# Patient Record
Sex: Female | Born: 1985 | Race: Black or African American | Hispanic: Yes | Smoking: Never smoker
Health system: Southern US, Community
[De-identification: ages and names within clinical notes are randomized; demographics above are authoritative.]

## PROBLEM LIST (undated history)

## (undated) DIAGNOSIS — M329 Systemic lupus erythematosus, unspecified: Secondary | ICD-10-CM

## (undated) DIAGNOSIS — F329 Major depressive disorder, single episode, unspecified: Secondary | ICD-10-CM

## (undated) DIAGNOSIS — M3500 Sicca syndrome, unspecified: Secondary | ICD-10-CM

## (undated) DIAGNOSIS — F419 Anxiety disorder, unspecified: Secondary | ICD-10-CM

## (undated) DIAGNOSIS — F32A Depression, unspecified: Secondary | ICD-10-CM

## (undated) HISTORY — DX: Systemic lupus erythematosus, unspecified: M32.9

## (undated) HISTORY — PX: DILATION AND CURETTAGE OF UTERUS: SHX78

## (undated) HISTORY — DX: Anxiety disorder, unspecified: F41.9

## (undated) HISTORY — DX: Depression, unspecified: F32.A

## (undated) HISTORY — DX: Major depressive disorder, single episode, unspecified: F32.9

## (undated) HISTORY — DX: Sjogren syndrome, unspecified: M35.00

## (undated) HISTORY — PX: COSMETIC SURGERY: SHX468

---

## 2012-11-07 DIAGNOSIS — M779 Enthesopathy, unspecified: Secondary | ICD-10-CM | POA: Insufficient documentation

## 2016-02-28 DIAGNOSIS — F419 Anxiety disorder, unspecified: Secondary | ICD-10-CM | POA: Insufficient documentation

## 2016-02-28 DIAGNOSIS — G8929 Other chronic pain: Secondary | ICD-10-CM | POA: Insufficient documentation

## 2016-02-28 DIAGNOSIS — M25579 Pain in unspecified ankle and joints of unspecified foot: Secondary | ICD-10-CM

## 2016-02-28 DIAGNOSIS — E669 Obesity, unspecified: Secondary | ICD-10-CM | POA: Insufficient documentation

## 2016-02-28 DIAGNOSIS — L83 Acanthosis nigricans: Secondary | ICD-10-CM | POA: Insufficient documentation

## 2016-02-28 DIAGNOSIS — G4700 Insomnia, unspecified: Secondary | ICD-10-CM | POA: Insufficient documentation

## 2016-04-24 DIAGNOSIS — M329 Systemic lupus erythematosus, unspecified: Secondary | ICD-10-CM | POA: Insufficient documentation

## 2016-09-25 ENCOUNTER — Ambulatory Visit (INDEPENDENT_AMBULATORY_CARE_PROVIDER_SITE_OTHER): Payer: Managed Care, Other (non HMO) | Admitting: Obstetrics and Gynecology

## 2016-09-25 ENCOUNTER — Encounter: Payer: Self-pay | Admitting: Obstetrics and Gynecology

## 2016-09-25 VITALS — BP 122/70 | HR 88 | Resp 16 | Ht 63.5 in | Wt 215.0 lb

## 2016-09-25 DIAGNOSIS — Z124 Encounter for screening for malignant neoplasm of cervix: Secondary | ICD-10-CM | POA: Diagnosis not present

## 2016-09-25 DIAGNOSIS — IMO0002 Reserved for concepts with insufficient information to code with codable children: Secondary | ICD-10-CM

## 2016-09-25 DIAGNOSIS — Z113 Encounter for screening for infections with a predominantly sexual mode of transmission: Secondary | ICD-10-CM | POA: Diagnosis not present

## 2016-09-25 DIAGNOSIS — Z8739 Personal history of other diseases of the musculoskeletal system and connective tissue: Secondary | ICD-10-CM

## 2016-09-25 DIAGNOSIS — M722 Plantar fascial fibromatosis: Secondary | ICD-10-CM | POA: Insufficient documentation

## 2016-09-25 DIAGNOSIS — M5430 Sciatica, unspecified side: Secondary | ICD-10-CM | POA: Insufficient documentation

## 2016-09-25 DIAGNOSIS — M329 Systemic lupus erythematosus, unspecified: Secondary | ICD-10-CM

## 2016-09-25 DIAGNOSIS — Z3009 Encounter for other general counseling and advice on contraception: Secondary | ICD-10-CM

## 2016-09-25 DIAGNOSIS — Z01411 Encounter for gynecological examination (general) (routine) with abnormal findings: Secondary | ICD-10-CM

## 2016-09-25 DIAGNOSIS — M3500 Sicca syndrome, unspecified: Secondary | ICD-10-CM

## 2016-09-25 NOTE — Progress Notes (Signed)
31 y.o. G1P0010 Legally Separated[place] AmericanF here for annual exam.   She was recently treated for BV. She reports frequent yeast, treats it with OTC medication.  Sexually active, new partner x 3 months. Occasionally using condoms. She does c/o pain with intercourse, entry and deep, more the dryness, lubrication helps.  Period Cycle (Days): 28 Period Duration (Days): 4-5 days  Period Pattern: Regular Menstrual Flow: Moderate Menstrual Control: Tampon Menstrual Control Change Freq (Hours): changes tampon every 4 hours  Dysmenorrhea: (!) Mild Dysmenorrhea Symptoms: Cramping, Headache  Patient's last menstrual period was 09/21/2016.          Sexually active: Yes.    The current method of family planning is none.    Exercising: No.  The patient does not participate in regular exercise at present. Smoker:  no  Health Maintenance: Pap:  3-4 years ago  History of abnormal Pap:  Yes years ago- did not follow up with it  MMG:  Never TDaP:  2017 Gardasil: completed all 3    reports that she has never smoked. She has never used smokeless tobacco. She reports that she drinks alcohol. She reports that she does not use drugs.Works as a Engineer, civil (consulting), currently works on a Designer, television/film set.   Past Medical History:  Diagnosis Date  . Anxiety   . Depression   . Lupus   . 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 COSMETIC SURGERY     lipo  . DILATION AND CURETTAGE OF UTERUS      Current Outpatient Prescriptions  Medication Sig Dispense Refill  . DULoxetine (CYMBALTA) 60 MG capsule Take 60 mg by mouth daily.    . hydroxychloroquine (PLAQUENIL) 200 MG tablet Take 200 mg by mouth daily.    . predniSONE (DELTASONE) 20 MG tablet Take 20 mg by mouth daily with breakfast.    . traMADol (ULTRAM) 50 MG tablet Take by mouth.     No current facility-administered medications for this visit.     Family History  Problem Relation Age of Onset  . Heart failure Father   .  Diabetes Maternal Grandmother   . Alzheimer's disease Paternal Grandfather     Review of Systems  Constitutional: Negative.   HENT: Negative.   Eyes: Negative.   Respiratory: Negative.   Cardiovascular: Negative.   Gastrointestinal: Negative.   Endocrine: Negative.   Genitourinary: Negative.   Musculoskeletal: Negative.   Skin: Negative.   Allergic/Immunologic: Negative.   Neurological: Negative.   Psychiatric/Behavioral: Negative.     Exam:   BP 122/70 (BP Location: Right Arm, Patient Position: Sitting, Cuff Size: Normal)   Pulse 88   Resp 16   Ht 5' 3.5" (1.613 m)   Wt 215 lb (97.5 kg)   LMP 09/21/2016   BMI 37.49 kg/m   Weight change: @ Height:   Height: 5' 3.5" (161.3 cm)  Ht Readings from Last 3 Encounters:  09/25/16 5' 3.5" (1.613 m)    General appearance: alert, cooperative and appears stated age Head: Normocephalic, without obvious abnormality, atraumatic Neck: no adenopathy, supple, symmetrical, trachea midline and thyroid normal to inspection and palpation Lungs: clear to auscultation bilaterally Cardiovascular: regular rate and rhythm Breasts: normal appearance, no masses or tenderness Abdomen: soft, non-tender; bowel sounds normal; no masses,  no organomegaly Extremities: extremities normal, atraumatic, no cyanosis or edema Skin: Skin color, texture, turgor normal. No rashes or lesions Lymph nodes: Cervical, supraclavicular, and axillary nodes normal. No abnormal inguinal nodes palpated Neurologic:  Grossly normal   Pelvic: External genitalia:  no lesions              Urethra:  normal appearing urethra with no masses, tenderness or lesions              Bartholins and Skenes: normal                 Vagina: normal appearing vagina with normal color and discharge, no lesions              Cervix: no lesions               Bimanual Exam:  Uterus:  normal size, contour, position, consistency, mobility, non-tender and retroverted               Adnexa: no mass, fullness, tenderness               Rectovaginal: Confirms               Anus:  normal sphincter tone, no lesions  Chaperone was present for exam.  A:  Well Woman with normal exam  H/O Lupus and Sjogren's   Desires pregnancy  P:   Pap with hpv  Labs with her primary and rheumatology  STD testing  Start PNV  She has a f/u appointment with MFM in 2 weeks to discuss pregnancy  Discussed breast self awareness

## 2016-09-25 NOTE — Patient Instructions (Signed)

## 2016-09-26 LAB — STD PANEL
HIV 1&2 Ab, 4th Generation: NONREACTIVE
Hepatitis B Surface Ag: NEGATIVE

## 2016-09-26 LAB — HEPATITIS C ANTIBODY: HCV Ab: NEGATIVE

## 2016-09-29 ENCOUNTER — Telehealth: Payer: Self-pay | Admitting: Obstetrics and Gynecology

## 2016-09-29 LAB — IPS N GONORRHOEA AND CHLAMYDIA BY PCR

## 2016-09-29 NOTE — Telephone Encounter (Signed)
Patient is returning a call to Elaine. °

## 2016-09-29 NOTE — Telephone Encounter (Signed)
Returned patients call and discuss lab results in detail. Advised that PAP is still pending -eh

## 2016-09-30 LAB — IPS PAP TEST WITH HPV

## 2016-10-01 ENCOUNTER — Telehealth: Payer: Self-pay | Admitting: *Deleted

## 2016-10-01 NOTE — Telephone Encounter (Signed)
Left message for patient to call regarding lab results -eh  

## 2016-10-01 NOTE — Telephone Encounter (Signed)
-----   Message from Romualdo Bolk, MD sent at 09/30/2016  4:58 PM EDT ----- Please advise the patient of normal results. 02 recall She did have yeast on her pap. If she is symptomatic, then call in diflucan 150 mg x 1, repeat x 1 in 72 hours if still symptomatic. #2, no refills

## 2016-10-07 MED ORDER — FLUCONAZOLE 150 MG PO TABS: 150.0000 mg | ORAL_TABLET | Freq: Once | ORAL | 0 refills | Status: AC

## 2016-10-07 NOTE — Telephone Encounter (Signed)
Spoke with patient and gave results. Patient is having symptoms of yeast. Advised that Diflucan will be called in. Patient voiced understanding -eh

## 2016-10-08 DIAGNOSIS — F064 Anxiety disorder due to known physiological condition: Secondary | ICD-10-CM | POA: Insufficient documentation

## 2016-10-08 DIAGNOSIS — F410 Panic disorder [episodic paroxysmal anxiety] without agoraphobia: Secondary | ICD-10-CM

## 2016-10-08 DIAGNOSIS — M329 Systemic lupus erythematosus, unspecified: Secondary | ICD-10-CM | POA: Insufficient documentation

## 2016-12-26 DIAGNOSIS — R110 Nausea: Secondary | ICD-10-CM | POA: Insufficient documentation

## 2017-08-10 ENCOUNTER — Other Ambulatory Visit: Payer: Self-pay | Admitting: Internal Medicine

## 2017-08-10 ENCOUNTER — Ambulatory Visit
Admission: RE | Admit: 2017-08-10 | Discharge: 2017-08-10 | Disposition: A | Discharge status: Home / Self Care | Payer: Managed Care, Other (non HMO) | Source: Ambulatory Visit | Attending: Internal Medicine | Admitting: Internal Medicine

## 2017-08-10 DIAGNOSIS — K5900 Constipation, unspecified: Secondary | ICD-10-CM

## 2017-08-10 DIAGNOSIS — R1084 Generalized abdominal pain: Secondary | ICD-10-CM

## 2017-08-27 ENCOUNTER — Ambulatory Visit (INDEPENDENT_AMBULATORY_CARE_PROVIDER_SITE_OTHER): Payer: Managed Care, Other (non HMO)

## 2017-08-27 ENCOUNTER — Ambulatory Visit: Payer: Managed Care, Other (non HMO) | Admitting: Podiatry

## 2017-08-27 ENCOUNTER — Encounter: Payer: Self-pay | Admitting: Podiatry

## 2017-08-27 DIAGNOSIS — M779 Enthesopathy, unspecified: Principal | ICD-10-CM

## 2017-08-27 DIAGNOSIS — M722 Plantar fascial fibromatosis: Secondary | ICD-10-CM | POA: Diagnosis not present

## 2017-08-27 DIAGNOSIS — M778 Other enthesopathies, not elsewhere classified: Secondary | ICD-10-CM

## 2017-08-27 DIAGNOSIS — M7752 Other enthesopathy of left foot: Secondary | ICD-10-CM

## 2017-08-27 NOTE — Progress Notes (Signed)
Subjective:  Patient ID: Courtney Martin, female    DOB: July 10, 1985,  MRN: [id number] HPI Chief Complaint  Patient presents with  . Plantar Fasciitis    Lt foot heel pain x 3-4 years, worsening, A.M. pain, has tried nitroglycerin patch. Recently Rt foot heel pain x several weeks    32 y.o. female presents with the above complaint.   She has a history of Sjogren's.  Rheumatologist stated that she was negative for RA.  Otherwise she denies fever chills nausea vomiting muscle aches and pains.  No calf pain shortness of breath headache.  No chest pain.  Past Medical History:  Diagnosis Date  . Anxiety   . Depression   . Lupus   . 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 COSMETIC SURGERY     lipo  . DILATION AND CURETTAGE OF UTERUS      Current Outpatient Medications:  .  carisoprodol (SOMA) 350 MG tablet, Soma 350 mg tablet  Take 1 tablet 3 times a day by oral route as needed for 30 days., Disp: , Rfl:  .  citalopram (CELEXA) 10 MG tablet, TK 1 T PO QD, Disp: , Rfl: 11 .  DULoxetine (CYMBALTA) 60 MG capsule, Take 60 mg by mouth daily., Disp: , Rfl:  .  fluconazole (DIFLUCAN) 150 MG tablet, fluconazole 150 mg tablet, Disp: , Rfl:  .  hydroxychloroquine (PLAQUENIL) 200 MG tablet, Take 200 mg by mouth daily., Disp: , Rfl:  .  predniSONE (DELTASONE) 20 MG tablet, Take 20 mg by mouth daily with breakfast., Disp: , Rfl:  .  pregabalin (LYRICA) 100 MG capsule, Lyrica 100 mg capsule  Take 1 capsule twice a day by oral route., Disp: , Rfl:  .  sulfaSALAzine (AZULFIDINE) 500 MG tablet, sulfasalazine 500 mg tablet, Disp: , Rfl:  .  zolpidem (AMBIEN) 5 MG tablet, zolpidem 5 mg tablet, Disp: , Rfl:   Allergies  Allergen Reactions  . Trazodone And Nefazodone Anxiety   Review of Systems Objective:  There were no vitals filed for this visit.  General: Well developed, nourished, in no acute distress, alert and oriented x3   Dermatological: Skin is warm,  dry and supple bilateral. Nails x 10 are well maintained; remaining integument appears unremarkable at this time. There are no open sores, no preulcerative lesions, no rash or signs of infection present.  Vascular: Dorsalis Pedis artery and Posterior Tibial artery pedal pulses are 2/4 bilateral with immedate capillary fill time. Pedal hair growth present. No varicosities and no lower extremity edema present bilateral.   Neruologic: Grossly intact via light touch bilateral. Vibratory intact via tuning fork bilateral. Protective threshold with Semmes Wienstein monofilament intact to all pedal sites bilateral. Patellar and Achilles deep tendon reflexes 2+ bilateral. No Babinski or clonus noted bilateral.   Musculoskeletal: No gross boney pedal deformities bilateral. No pain, crepitus, or limitation noted with foot and ankle range of motion bilateral. Muscular strength 5/5 in all groups tested bilateral.  Gait: Unassisted, Nonantalgic.    Radiographs:  Radiographs 3 views bilateral taken today demonstrates no acute findings.  No fractures.  Very little in the way of soft tissue inflammation at the plantar fascial calcaneal insertion site.    Assessment & Plan:   Assessment: Plantar fasciitis bilateral.  Plan: We discussed the etiology pathology conservative versus surgical therapies.  After sterile Betadine skin prep injected 20 mg Kenalog 5 mg Marcaine to the point of maximal tenderness of the plantar fascial calcaneal  insertion site.  She tolerated procedure well without complications.  She is already taking prednisone so we will not add any to that.  I did dispense plantar fascial braces and a night splint.  She will follow-up with me in 1 month.  At that point we may need to consider MRI for orthotics if she is improving.     Marianny Goris T. Water MillHyatt, [place]

## 2017-08-27 NOTE — Patient Instructions (Signed)

## 2017-09-24 ENCOUNTER — Encounter: Payer: Self-pay | Admitting: Podiatry

## 2017-09-24 ENCOUNTER — Telehealth: Payer: Self-pay | Admitting: *Deleted

## 2017-09-24 ENCOUNTER — Ambulatory Visit (INDEPENDENT_AMBULATORY_CARE_PROVIDER_SITE_OTHER): Payer: Managed Care, Other (non HMO) | Admitting: Podiatry

## 2017-09-24 DIAGNOSIS — M722 Plantar fascial fibromatosis: Secondary | ICD-10-CM

## 2017-09-24 NOTE — Progress Notes (Signed)
She presents today for follow-up of her bilateral heels left greater than right states that it still comes and goes and the shot helped for about 3 days and the brace is upset my skin so much it is can wear them.  More that I can wear as the nighttime when it is seems to help a little bit.  Objective: Vital signs are stable she is alert and oriented x3.  Pulses are palpable.  Neurologic sensorium is intact deep tendon reflexes are intact she still has severe pain on palpation of the heel left.  Assessment: Chronic intractable plantar fasciitis left cannot rule out a tear Norco rule out a tear to the distalmost aspect of the Achilles as it inserts into the fascial fibers.  Plan: At this point since is been going on for many years I feel that is necessary to perform an MRI to evaluate.  This patient does also have a history of Sjogren's.

## 2017-09-24 NOTE — Telephone Encounter (Signed)
-----   Message from Kristian CoveyAshley E Prevette, [facility] sent at 09/24/2017 12:15 PM EDT ----- Regarding: MRI MRI left rearfoot - evaluate plantar fascial tear/achilles tendon tear left - surgical consideration

## 2017-10-08 ENCOUNTER — Ambulatory Visit
Admission: RE | Admit: 2017-10-08 | Discharge: 2017-10-08 | Disposition: A | Discharge status: Home / Self Care | Payer: Managed Care, Other (non HMO) | Source: Ambulatory Visit | Attending: Podiatry | Admitting: Podiatry

## 2017-10-12 ENCOUNTER — Telehealth: Payer: Self-pay | Admitting: *Deleted

## 2017-10-12 NOTE — Telephone Encounter (Signed)
-----   Message from Elinor Parkinson, [place] sent at 10/12/2017  7:00 AM EDT ----- Send for an over read and inform patient of the delay.

## 2017-10-12 NOTE — Telephone Encounter (Signed)
Mailed copy of MRI disc to [facility]. 

## 2017-10-12 NOTE — Telephone Encounter (Signed)
Left message informing pt Dr. Al Corpus had reviewed  MRI results and he would like to send a copy of the MRI disc to a radiology specialist for indepth reading for treatment planning, there would be a 7-10 day delay for the final results and she would be contacted to schedule once results were received.

## 2017-10-19 ENCOUNTER — Encounter: Payer: Self-pay | Admitting: Podiatry

## 2017-11-19 DIAGNOSIS — M51369 Other intervertebral disc degeneration, lumbar region without mention of lumbar back pain or lower extremity pain: Secondary | ICD-10-CM | POA: Insufficient documentation

## 2017-11-19 DIAGNOSIS — M5136 Other intervertebral disc degeneration, lumbar region: Secondary | ICD-10-CM | POA: Insufficient documentation

## 2017-12-07 ENCOUNTER — Telehealth: Payer: Self-pay | Admitting: Podiatry

## 2017-12-07 NOTE — Telephone Encounter (Signed)
I'm a pt of Dr. Geryl RankinsHyatt's and I had an MRI done on 02 May. I was calling to get the results from the MRI of my left foot. If you could give me a call back, my number is [phone]. I'm going to be out of town so I won't be able to make an appointment with Dr. Al CorpusHyatt to discuss so I was wondering if someone could just call me with the results. That way I would have an idea of what's going on since the pain is still here. I would greatly appreciate it. Thank you so much. Bye bye.

## 2017-12-08 ENCOUNTER — Ambulatory Visit (INDEPENDENT_AMBULATORY_CARE_PROVIDER_SITE_OTHER): Payer: Managed Care, Other (non HMO) | Admitting: Podiatry

## 2017-12-08 ENCOUNTER — Other Ambulatory Visit: Payer: Self-pay

## 2017-12-08 DIAGNOSIS — M722 Plantar fascial fibromatosis: Secondary | ICD-10-CM

## 2017-12-08 NOTE — Telephone Encounter (Signed)
Plantar fascitis foo

## 2017-12-08 NOTE — Telephone Encounter (Signed)
Pretty much that's it.  I still want to see her when she gets back.

## 2017-12-08 NOTE — Telephone Encounter (Signed)
I informed pt of Dr. Geryl RankinsHyatt's review of MRI results and orders. Transferred pt to schedulers.

## 2017-12-09 NOTE — Progress Notes (Signed)
She presents today for follow-up of her plantar fasciitis and her MRI.  She states is really no change.  Objective: Vital signs are stable she is alert and oriented x3.  Pulses are palpable.  She has pain on palpation medial calcaneal tubercle bilaterally.  MRI does state left foot demonstrates plantar fasciitis.  Assessment: Chronic intractable plantar fasciitis left.  Without tear.  Plan: Discussed etiology pathology conservative or surgical therapy [place] middle of the month and will start out a podiatrist there and I provided her with her MRI report.

## 2020-01-15 IMAGING — CR DG ABDOMEN 2V
2 series · 2 of 2 positions shown · non-contrast
Comparison: None.

CLINICAL DATA: Left abdominal pain for several days.  Constipation.

EXAM:
ABDOMEN - 2 VIEW

[t abdomen supine]
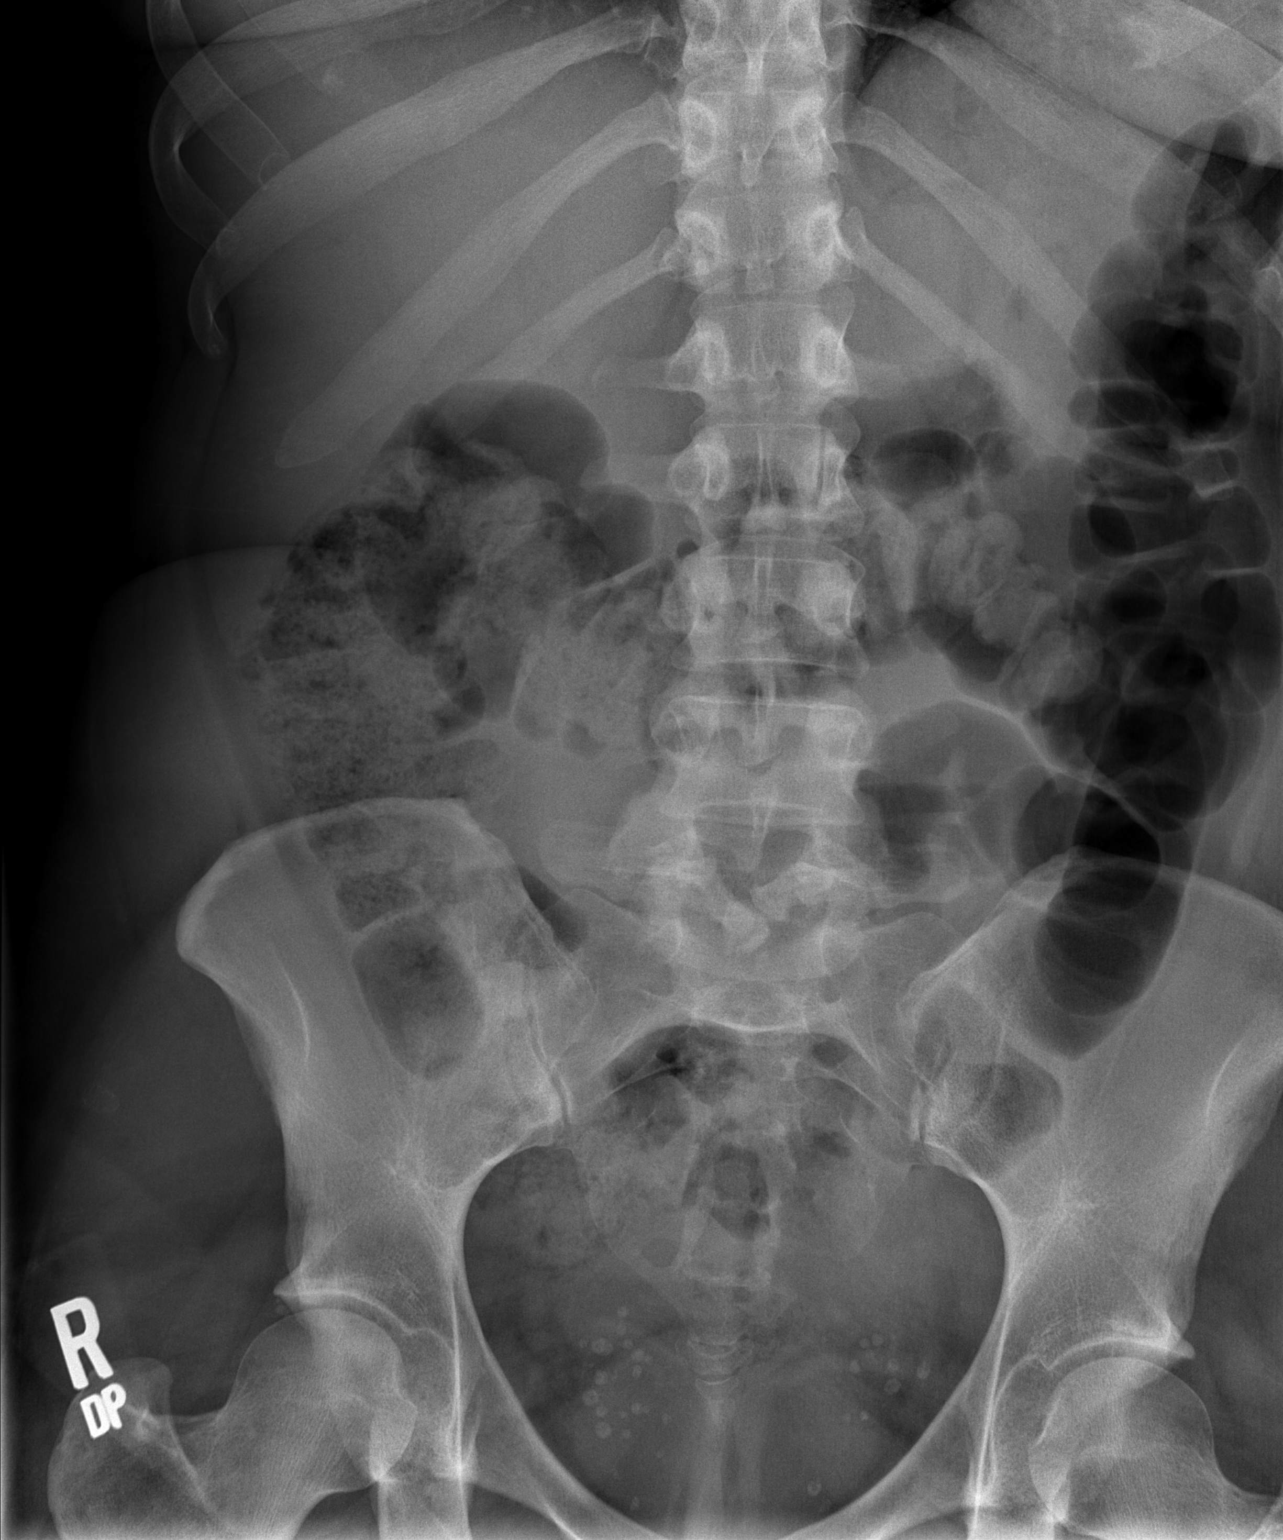

[w abdomen upright]
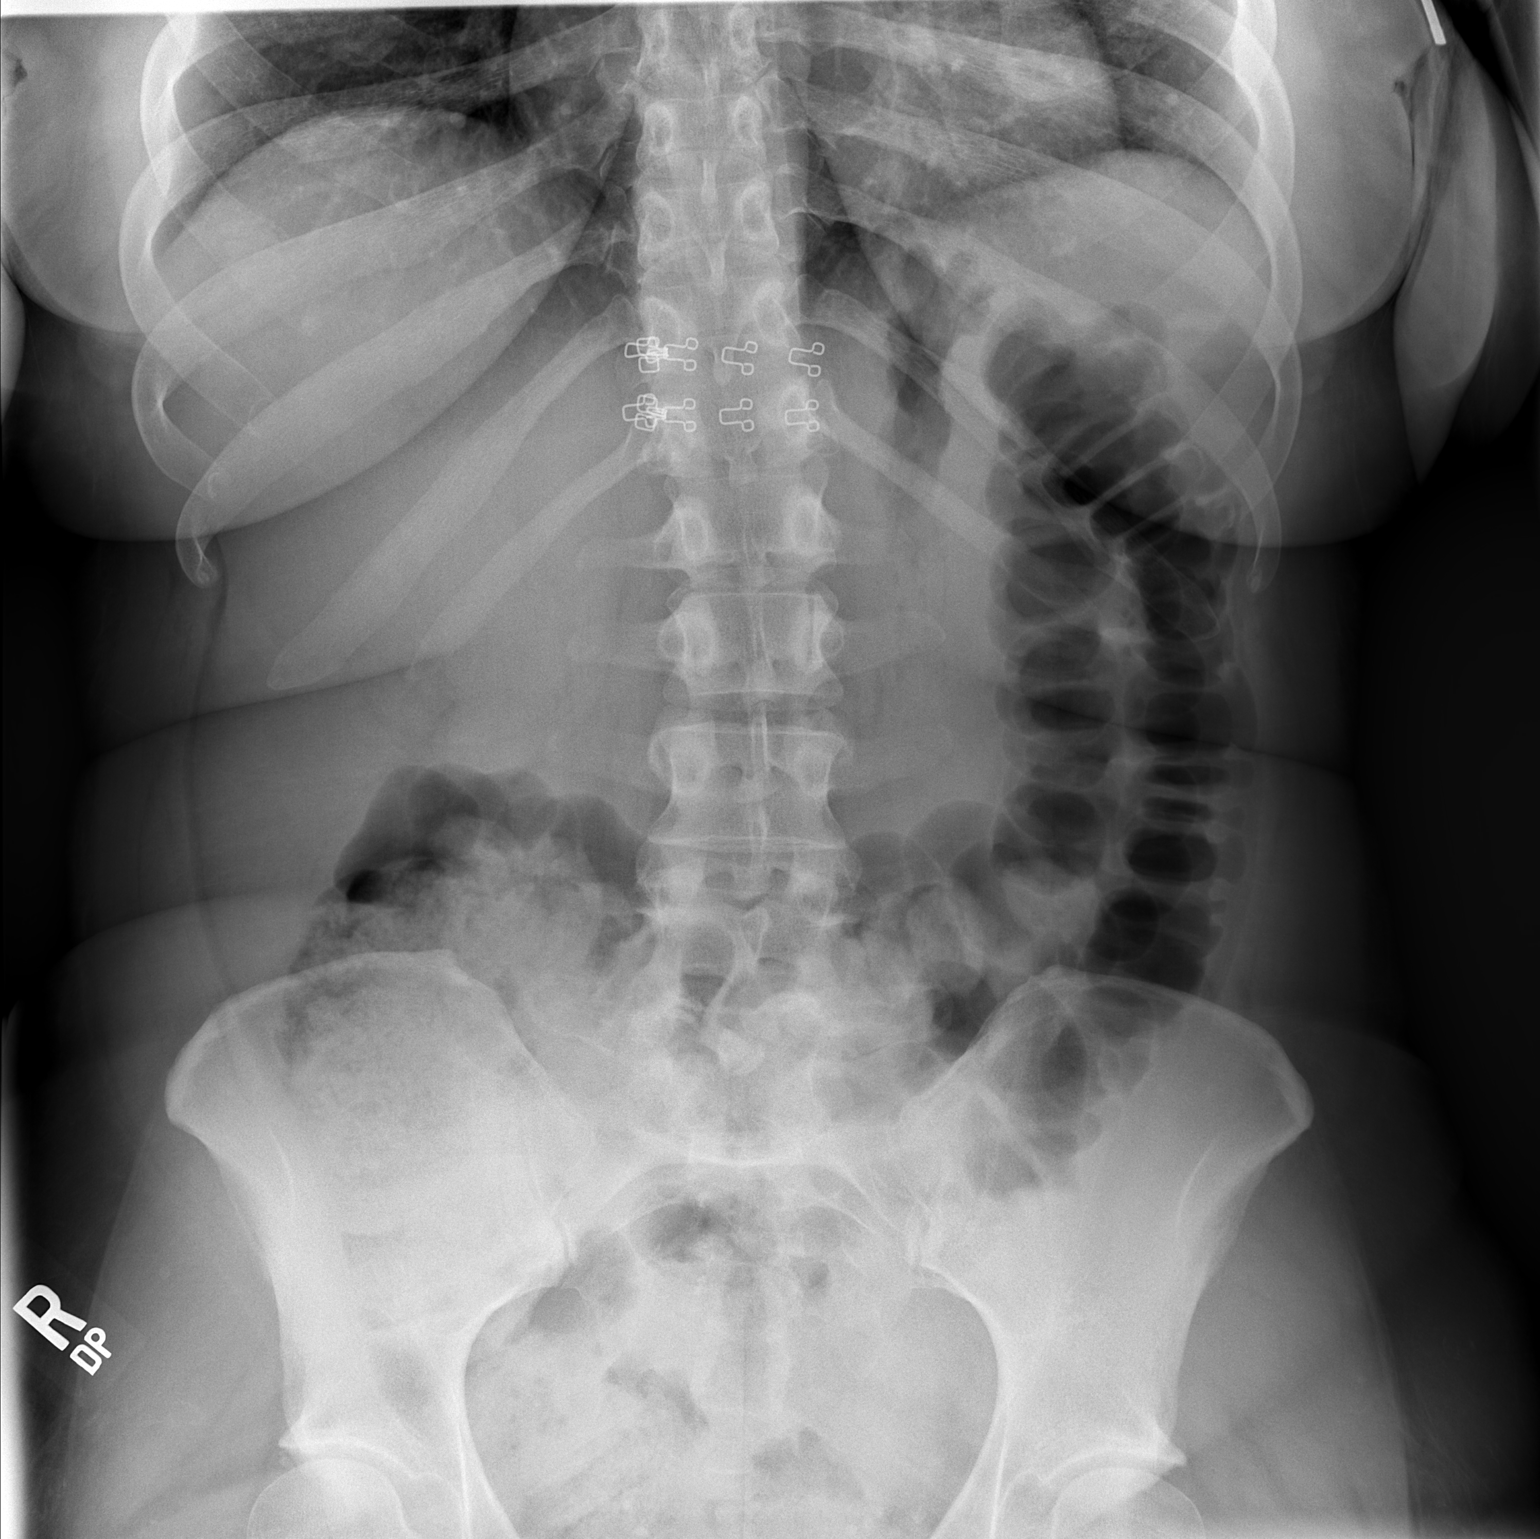

[2 of 2 positions shown; findings below may reference images not displayed]

FINDINGS: Normal bowel gas pattern.  No significant increase in colonic stool.

No evidence of renal or ureteral stones. Multiple phleboliths noted
in the lower pelvis. Soft tissues are otherwise unremarkable.

Normal skeletal structures.
IMPRESSION: Negative.
